# Patient Record
Sex: Male | Born: 1975
Health system: Southern US, Community
[De-identification: ages and names within clinical notes are randomized; demographics above are authoritative.]

## PROBLEM LIST (undated history)

## (undated) DIAGNOSIS — F32A Depression, unspecified: Secondary | ICD-10-CM

## (undated) DIAGNOSIS — F329 Major depressive disorder, single episode, unspecified: Secondary | ICD-10-CM

## (undated) DIAGNOSIS — T7840XA Allergy, unspecified, initial encounter: Secondary | ICD-10-CM

## (undated) DIAGNOSIS — E785 Hyperlipidemia, unspecified: Secondary | ICD-10-CM

## (undated) HISTORY — DX: Allergy, unspecified, initial encounter: T78.40XA

## (undated) HISTORY — DX: Depression, unspecified: F32.A

## (undated) HISTORY — DX: Major depressive disorder, single episode, unspecified: F32.9

## (undated) HISTORY — DX: Hyperlipidemia, unspecified: E78.5

---

## 2001-02-15 ENCOUNTER — Encounter: Admission: RE | Admit: 2001-02-15 | Discharge: 2001-02-15 | Payer: Self-pay | Admitting: Occupational Medicine

## 2001-02-15 ENCOUNTER — Encounter: Payer: Self-pay | Admitting: Occupational Medicine

## 2001-08-14 ENCOUNTER — Emergency Department (HOSPITAL_COMMUNITY): Admission: EM | Admit: 2001-08-14 | Discharge: 2001-08-14 | Payer: Self-pay | Admitting: Internal Medicine

## 2003-03-20 ENCOUNTER — Emergency Department (HOSPITAL_COMMUNITY): Admission: EM | Admit: 2003-03-20 | Discharge: 2003-03-20 | Payer: Self-pay | Admitting: Emergency Medicine

## 2003-03-20 ENCOUNTER — Encounter: Payer: Self-pay | Admitting: Emergency Medicine

## 2016-09-03 ENCOUNTER — Telehealth: Payer: Self-pay | Admitting: *Deleted

## 2016-09-03 NOTE — Telephone Encounter (Signed)
Unable to reach patient at time of Pre-Visit Call.  Left message for patient to return call when available.    

## 2016-09-04 ENCOUNTER — Ambulatory Visit (INDEPENDENT_AMBULATORY_CARE_PROVIDER_SITE_OTHER): Payer: BLUE CROSS/BLUE SHIELD | Admitting: Medical

## 2016-09-04 ENCOUNTER — Ambulatory Visit (HOSPITAL_BASED_OUTPATIENT_CLINIC_OR_DEPARTMENT_OTHER)
Admission: RE | Admit: 2016-09-04 | Discharge: 2016-09-04 | Disposition: A | Discharge status: Home / Self Care | Payer: BLUE CROSS/BLUE SHIELD | Source: Ambulatory Visit | Attending: Medical | Admitting: Medical

## 2016-09-04 ENCOUNTER — Encounter: Payer: Self-pay | Admitting: Medical

## 2016-09-04 VITALS — BP 124/80 | Temp 97.9°F | Ht 70.0 in | Wt 221.6 lb

## 2016-09-04 DIAGNOSIS — M5442 Lumbago with sciatica, left side: Secondary | ICD-10-CM | POA: Diagnosis not present

## 2016-09-04 DIAGNOSIS — Z8669 Personal history of other diseases of the nervous system and sense organs: Secondary | ICD-10-CM | POA: Diagnosis not present

## 2016-09-04 MED ORDER — DICLOFENAC SODIUM 75 MG PO TBEC: 75.0000 mg | DELAYED_RELEASE_TABLET | Freq: Two times a day (BID) | ORAL | 1 refills | Status: DC

## 2016-09-04 MED FILL — DICLOFENAC SOD 75 MG TAB EC: 75 | 15 days supply | Qty: 30 | Fill #0

## 2016-09-04 NOTE — Patient Instructions (Signed)
For your sciatica type back pain will get xray of lumbar spine today. Will rx diclofenac for your pain and inflammation. Stop any otc nsaids while on diclofenac.  Try conservative measures as discussed for sciatica.  Red flag signs and symptoms reviewed that would require ED evaluation.  Regarding rare migraines can write rx preventative med if they become more frequent or order imaging studies if more frequent as well. HA precaution/signs/symptoms that would indicate ED evaluation as well.  Follow up in 3 weeks or as needed

## 2016-09-04 NOTE — Progress Notes (Signed)
Pre visit review using our clinic review tool, if applicable. No additional management support is needed unless otherwise documented below in the visit note. 

## 2016-09-04 NOTE — Progress Notes (Signed)
Subjective:    Patient ID: Jonathan Bennett, male    DOB: [birth year], 40 y.o.   MRN: [id number]  HPI   I have reviewed pt PMH, PSH, FH, Social History and Surgical History  Haematologistupervisor for construction. Pt does no formal exercise, he eats a lot of fast foods, 1-2 cups of coffee a day, married- 2 children.   Spring allergies moderate to severe in spring. He takes not meds.  Hyperlipidemia- 2-3 years ago was on cholesterol med and just stopped. Ran out and did not want to return due to his work schedule.  Pt has left SI area pain. Pt pain been present for couple of years. Pain level various. Sometimes mild to severe. Other time not present. Pain level varies from week to week. Recently more moderate to severe. Worse with riding long distance. No hx of trauma fall or injury. Pt used only ibuprofen 800 mg  in past for pain. But did not help. Pt states heating pad helped some.  Pt states occasional pain in mid lower back on occasion. Regarding back pain. No incontinence. 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  Pt also get occasional rare migraine. About one time a month. Some light sensitivity and sound sensitivity. Sound is worse than light. Takes excedrin migraine medicine.  Pt does not get  flu vaccine.     Review of Systems  Constitutional: Negative for chills, fatigue and fever.  Respiratory: Negative for cough, chest tightness and wheezing.   Cardiovascular: Negative for chest pain and palpitations.  Musculoskeletal: Positive for back pain. Negative for myalgias, neck pain and neck stiffness.       Sciatica pain left side.  Skin: Negative for rash.  Neurological: Negative for dizziness.       Some pain that radiates to left lower ext to ankle from his sciatica area.  Psychiatric/Behavioral: Negative for behavioral problems and confusion.   Past Medical History:  Diagnosis Date  . Allergy   . Depression    pt  minimizes his mood. Wife thinks he is maybe depressed. But more annoyed about life per pt at times.  . Hyperlipidemia      Social History   Social History  . Marital status: Married    Spouse name: N/A  . Number of children: N/A  . Years of education: N/A   Occupational History  . Not on file.   Social History Main Topics  . Smoking status: Never Smoker  . Smokeless tobacco: Current User  . Alcohol use No     Comment: Pt drinks on weekends. Will drink 12-14 beer  saturday and sunday.  . Drug use: No  . Sexual activity: Yes   Other Topics Concern  . Not on file   Social History Narrative  . No narrative on file    History reviewed. No pertinent surgical history.  Family History  Problem Relation Age of Onset  . Hypertension Mother     No Known Allergies  No current outpatient prescriptions on file prior to visit.   No current facility-administered medications on file prior to visit.     BP 124/80 (BP Location: Right Arm, Patient Position: Sitting)   Temp 97.9 F (36.6 C) (Oral)   Ht 5\' 10"  (1.778 m)   Wt 221 lb 9.6 oz (100.5 kg)   BMI 31.80 kg/m       Objective:   Physical Exam  General Appearance- Not in acute distress.  Chest and Lung Exam Auscultation: Breath sounds:-Normal. Clear even and unlabored. Adventitious sounds:- No Adventitious sounds.  Cardiovascular Auscultation:Rythm - Regular, rate and rythm. Heart Sounds -Normal heart sounds.  Abdomen Inspection:-Inspection Normal.  Palpation/Perucssion: Palpation and Percussion of the abdomen reveal- Non Tender, No Rebound tenderness, No rigidity(Guarding) and No Palpable abdominal masses.  Liver:-Normal.  Spleen:- Normal.   Back No mid  lumbar spine tenderness to palpation today. Marland Kitchen. No pain on straight leg lift. Pain on lateral movements lt si area. Moderte to severe direct lt si area tenderness.  Lower ext neurologic  L5-S1 sensation intact bilaterally. Normal patellar reflexes  bilaterally. No foot drop bilaterally.  Neuro- CN III- XII grossly intact.       Assessment & Plan:  For your sciatica type back pain will get xray of lumbar spine today. Will rx diclofenac for your pain and inflammation. Stop any otc nsaids while on diclofenac.  Try conservative measures as discussed for sciatica.  Red flag signs and symptoms reviewed that would require ED evaluation.  Regarding rare migraines can write rx preventative med if they become more frequent or order imaging studies if more frequent as well. HA precaution/signs/symptoms that would indicate ED evaluation as well.  Follow up in 3 weeks or as needed  Importance of periodic visits explained.  Pt wife wife indicates he is very resistant to coming in for evaluation and treatments. He may rarely come in by wife and pt description(works out of town and on weekdays). So I advised that Saturday clinic is availlable get address and phone number as they leave today.  Will hopefully be able to schedule him for CPE fasting in near future.  Cristan Hout, Ramon DredgeEdward, PA-C

## 2016-09-18 ENCOUNTER — Telehealth: Payer: Self-pay | Admitting: Medical

## 2016-09-18 MED ORDER — MELOXICAM 15 MG PO TABS: 15.0000 mg | ORAL_TABLET | Freq: Every day | ORAL | 0 refills | Status: DC

## 2016-09-18 NOTE — Telephone Encounter (Signed)
Got request after hours for med for back pain since diclofenac not working. Notify pt rx meloxicam. Stop diclofenac and stop any otc nsaids.

## 2016-09-18 NOTE — Telephone Encounter (Signed)
Please advise 

## 2016-09-18 NOTE — Telephone Encounter (Signed)
Caller name: Ola Relationship to patient: Wife Can be reached: [phone]  Pharmacy:  Reason for call: Wife states that the diclofenac (VOLTAREN) 75 MG EC tablet has not helped patient and he needs something else.

## 2016-09-21 ENCOUNTER — Telehealth: Payer: Self-pay | Admitting: Medical

## 2016-09-21 MED ORDER — MELOXICAM 15 MG PO TABS: 15.0000 mg | ORAL_TABLET | Freq: Every day | ORAL | 0 refills | Status: DC

## 2016-09-21 NOTE — Telephone Encounter (Signed)
Patient's wife called stating that the pharmacy doesn't have the medication that was called in. Please advise.    meloxicam (MOBIC) 15 MG tablet   Pharmacy: [facility] - [place], [place] - [address]

## 2016-09-21 NOTE — Addendum Note (Signed)
Addended by: Neldon LabellaMABE, HOLDEN S on: 09/21/2016 03:31 PM   Modules accepted: Orders

## 2016-09-21 NOTE — Telephone Encounter (Signed)
I spoke with the patient and he voices understanding. I cancelled the Rx at [facility] and resent [facility] to [facility] [place]

## 2016-09-21 NOTE — Telephone Encounter (Signed)
I spoke with the pharmacy and they advised me that the Mobic was being filled on 09/21/16.

## 2016-10-02 ENCOUNTER — Telehealth: Payer: Self-pay | Admitting: Medical

## 2016-10-02 MED ORDER — PREDNISONE 10 MG PO TABS: ORAL_TABLET | ORAL | 0 refills | Status: AC

## 2016-10-02 MED ORDER — CYCLOBENZAPRINE HCL 10 MG PO TABS: 10.0000 mg | ORAL_TABLET | Freq: Every day | ORAL | 0 refills | Status: DC

## 2016-10-02 NOTE — Telephone Encounter (Signed)
Please advise 

## 2016-10-02 NOTE — Telephone Encounter (Signed)
Pt appeared to have sciatica by exam last visit. Prednisone can be very good option since failed mobic. So have him stop mobic and do not take any nsaid otc such as ibuprofen or alleve.  Start prednisone taper dose pack.  Can give flexeril tabs to use at night prior to sleep.  I don't think narcotics are indicated. Also not in office to sign controlled med script even if thought indicated.  If fails this then would try to order mri of his back. He will also need a visit maybe Tuesday or Wednesday if not feeling better. Not on Monday.(not sure if my voice will be back by then).  If any severe signs or symptoms over weekend then UC or ED.

## 2016-10-02 NOTE — Telephone Encounter (Signed)
Caller name: Ola Relationship to patient: wIfe Can be reached: [phone] Pharmacy:  [facility] - [place], [place] - [address] [phone] (Phone) [phone] (Fax)    Reason for call: Wife states that patient has been taking Mobic for over a week and it is not helping him. Request another pain medication

## 2016-10-02 NOTE — Telephone Encounter (Signed)
Sent prednisone and flexeril to his pharmacy. Double check went through and sent to one he wanted.Please

## 2016-10-05 MED ORDER — CYCLOBENZAPRINE HCL 10 MG PO TABS
10.0000 mg | ORAL_TABLET | Freq: Every day | ORAL | 0 refills | Status: AC
Start: 2016-10-05 — End: ?

## 2016-10-05 NOTE — Telephone Encounter (Signed)
I [place] the patients [place] ([place] and she is aware that the Rx's are at [facility] for pickup.

## 2018-04-07 IMAGING — DX DG LUMBAR SPINE 2-3V
3 series · 3 of 3 positions shown · non-contrast
Comparison: None in PACs

CLINICAL DATA: Left hip pain radiating into the left knee for
several months without known injury

EXAM:
LUMBAR SPINE - 2-3 VIEW

[l-spine ap]
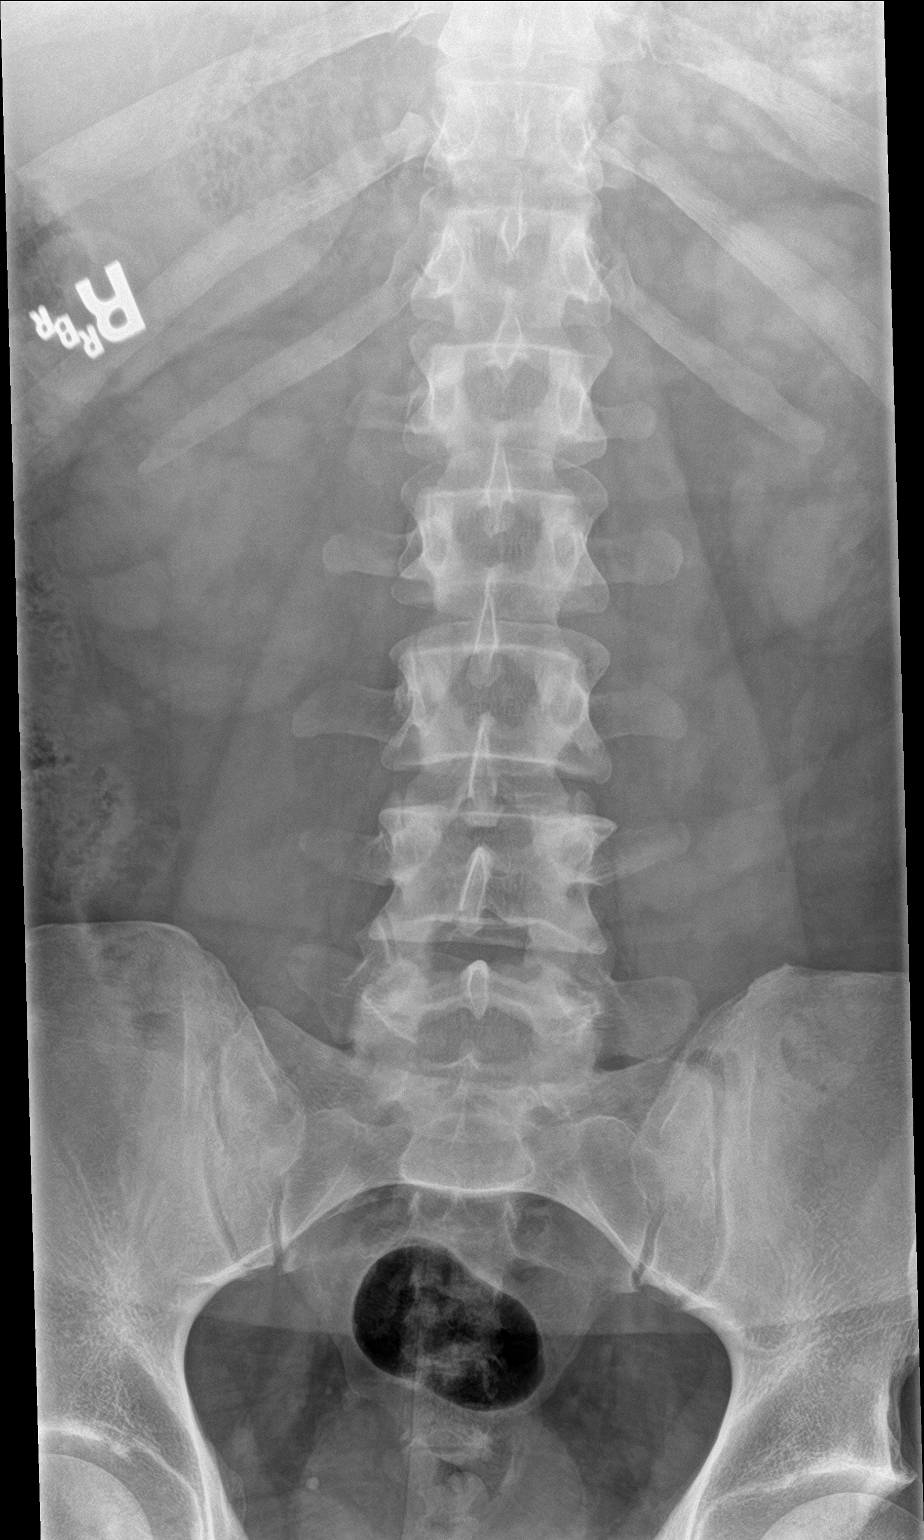

[l-spine lat]
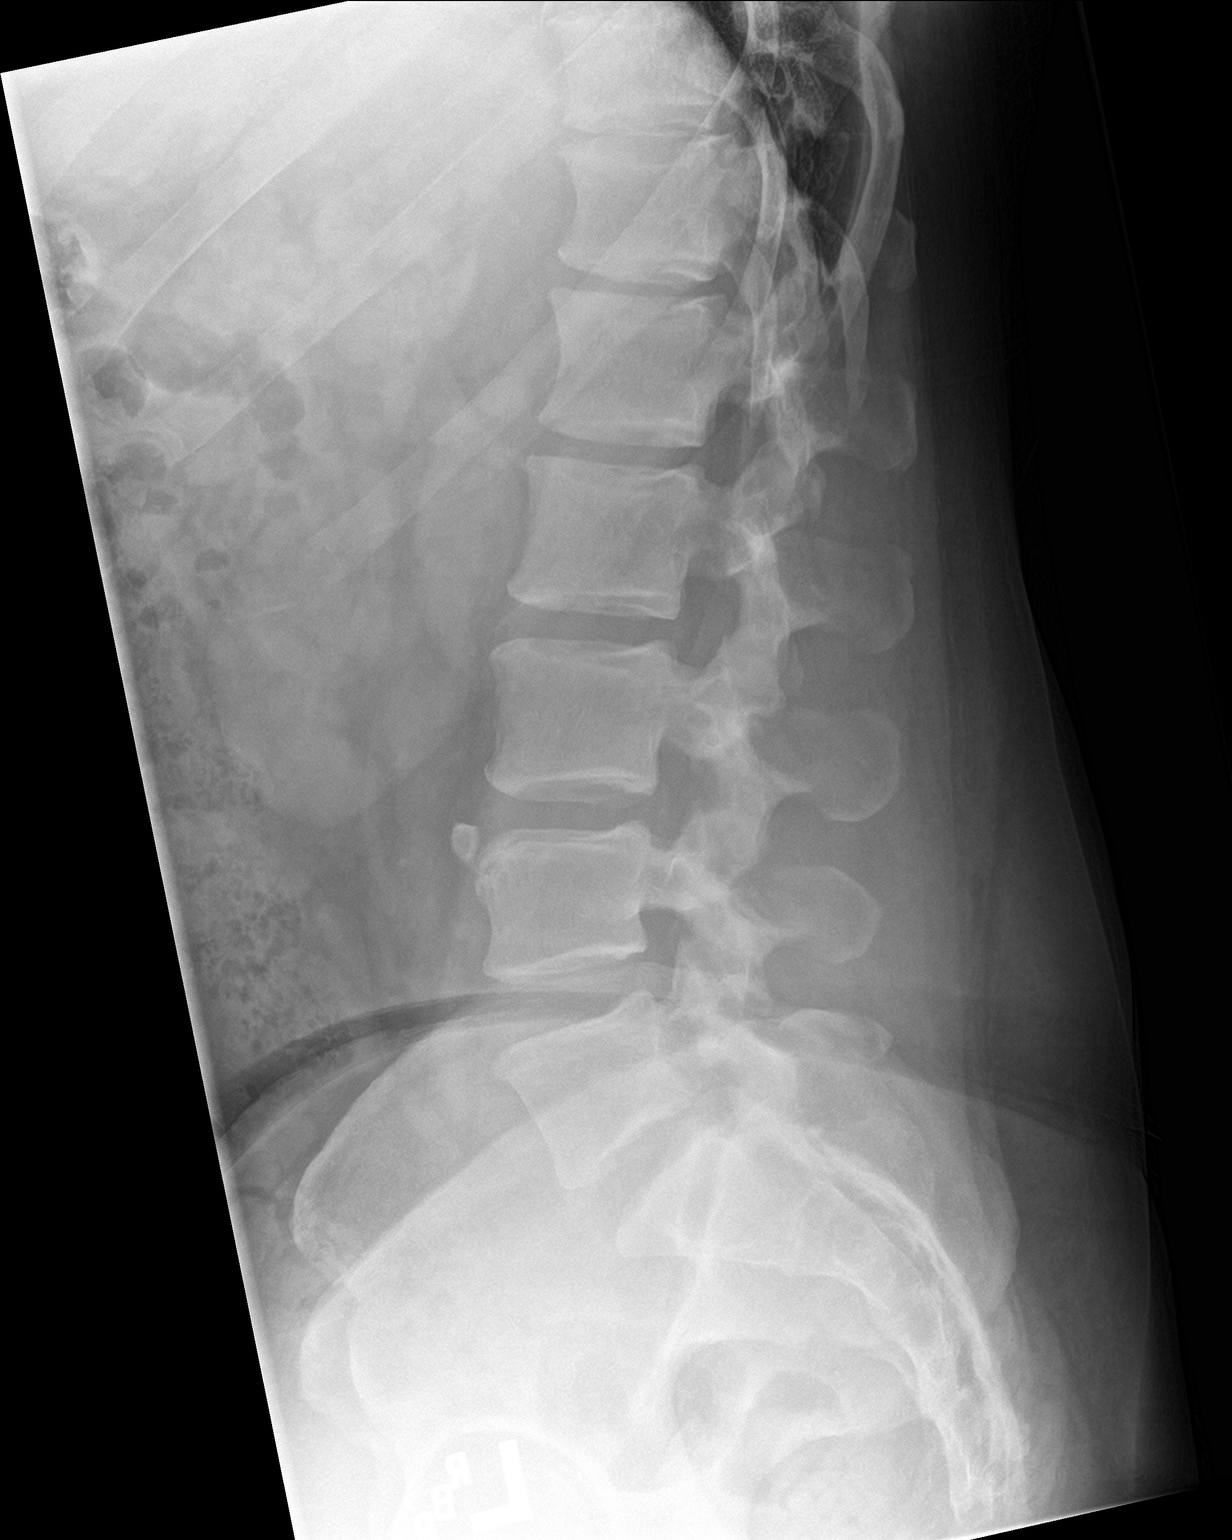

[l-spine spot]
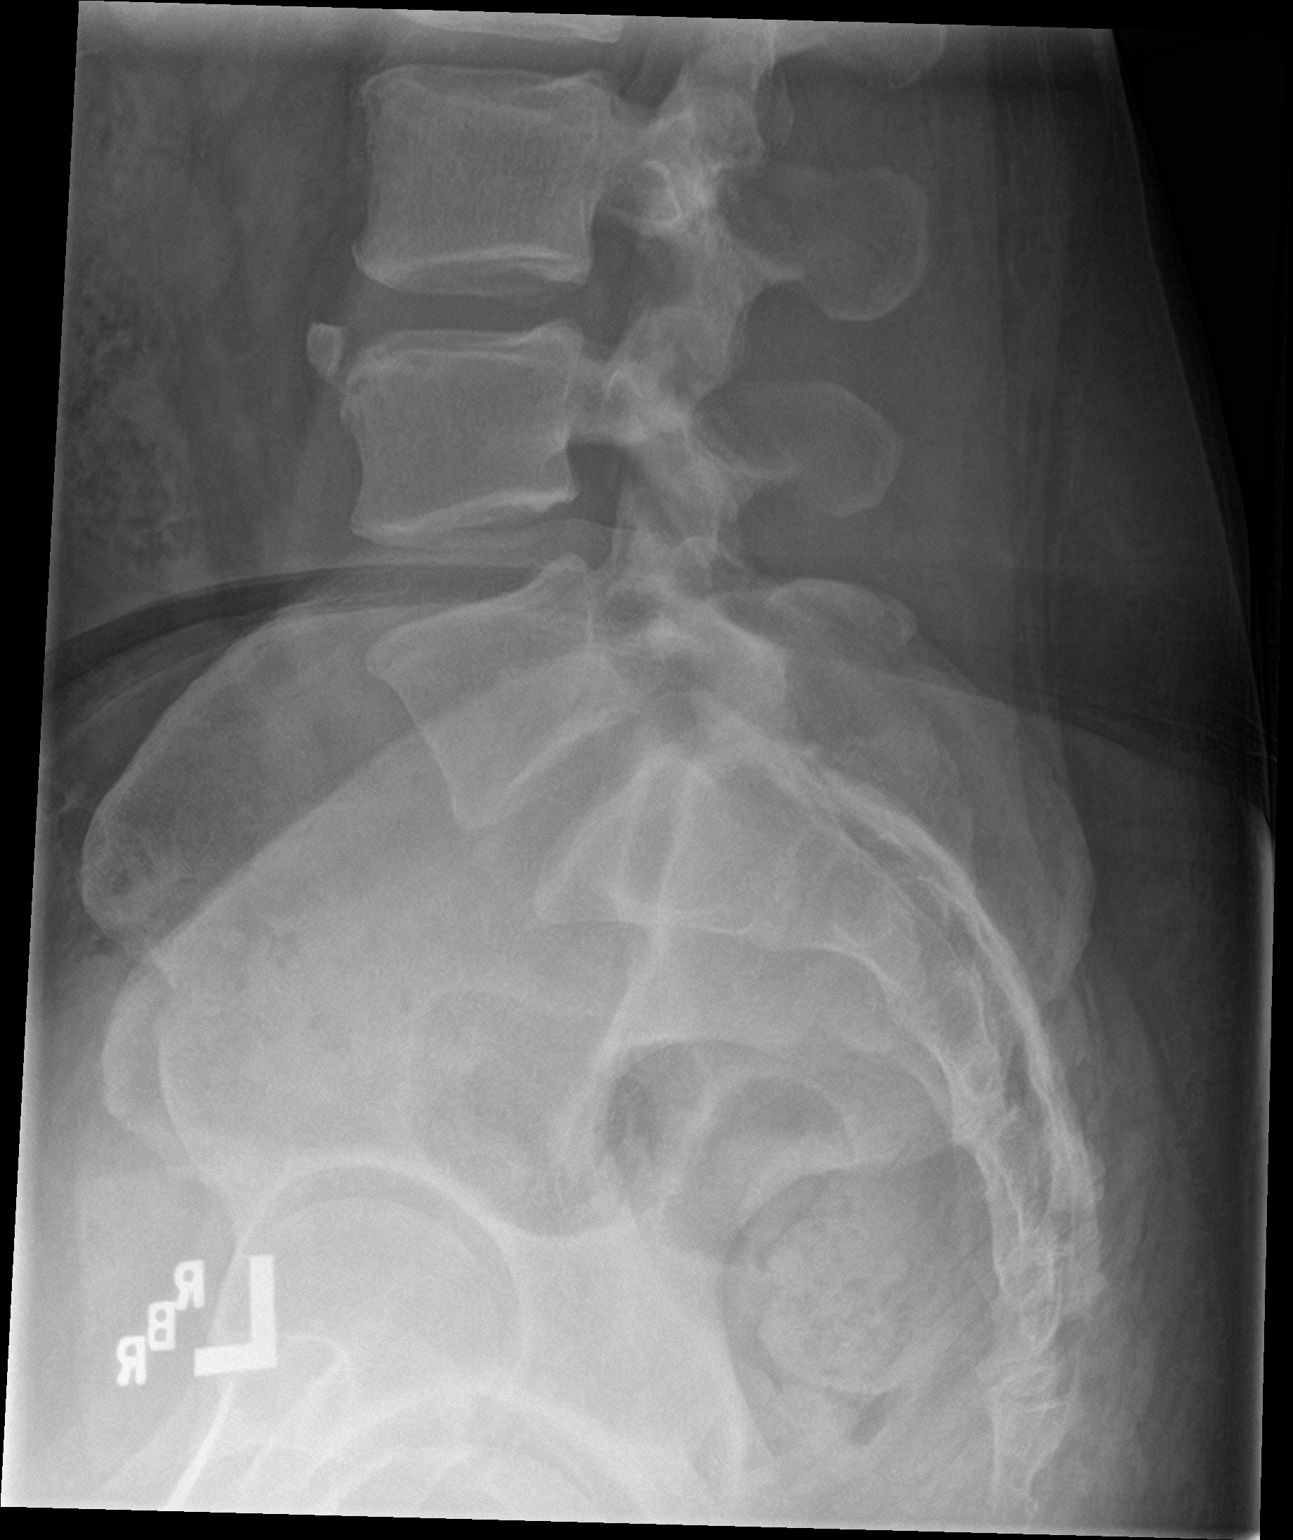

[3 of 3 positions shown; findings below may reference images not displayed]

FINDINGS: The lumbar vertebral bodies are preserved in height. The disc space
heights are well maintained. There is no spondylolisthesis. There is
no significant facet joint hypertrophy. The pedicles and transverse
processes are intact. There is bony density adjacent to the anterior
superior endplate of L4 which may reflect a limbus vertebra with
mild distraction of the bony fragment.
IMPRESSION: There is no acute or significant chronic bony abnormality of the
lumbar spine. If the patient's symptoms persist and are felt to be
radicular in nature, lumbar spine MRI may be the most useful next
imaging step.

## 2019-02-09 ENCOUNTER — Telehealth: Payer: Self-pay

## 2019-02-09 NOTE — Telephone Encounter (Signed)
Last ov 2017. Virtual Visit?

## 2019-02-13 NOTE — Telephone Encounter (Signed)
Tried to reach pt. Pt did not answer and vm is not set up.

## 2022-02-12 ENCOUNTER — Other Ambulatory Visit (HOSPITAL_COMMUNITY): Payer: Self-pay
